# Patient Record
Sex: Male | Born: 1985 | Hispanic: Yes | Marital: Single | Smoking: Current every day smoker
Health system: Southern US, Community
[De-identification: ages and names within clinical notes are randomized; demographics above are authoritative.]

---

## 2017-08-13 ENCOUNTER — Emergency Department: Payer: Self-pay

## 2017-08-13 ENCOUNTER — Other Ambulatory Visit: Payer: Self-pay

## 2017-08-13 ENCOUNTER — Emergency Department
Admission: EM | Admit: 2017-08-13 | Discharge: 2017-08-13 | Disposition: A | Discharge status: Home / Self Care | Payer: Self-pay | Attending: Student in an Organized Health Care Education/Training Program | Admitting: Student in an Organized Health Care Education/Training Program

## 2017-08-13 DIAGNOSIS — R51 Headache: Secondary | ICD-10-CM | POA: Insufficient documentation

## 2017-08-13 DIAGNOSIS — R202 Paresthesia of skin: Secondary | ICD-10-CM | POA: Insufficient documentation

## 2017-08-13 DIAGNOSIS — F10929 Alcohol use, unspecified with intoxication, unspecified: Secondary | ICD-10-CM

## 2017-08-13 DIAGNOSIS — F4329 Adjustment disorder with other symptoms: Secondary | ICD-10-CM | POA: Insufficient documentation

## 2017-08-13 DIAGNOSIS — F10129 Alcohol abuse with intoxication, unspecified: Secondary | ICD-10-CM | POA: Insufficient documentation

## 2017-08-13 LAB — CBC WITH DIFFERENTIAL/PLATELET
Basophils Absolute: 0 10*3/uL (ref 0–0.1)
Basophils Relative: 1 %
Eosinophils Absolute: 0.1 10*3/uL (ref 0–0.7)
Eosinophils Relative: 2 %
HCT: 42.5 % (ref 40.0–52.0)
HEMOGLOBIN: 15.3 g/dL (ref 13.0–18.0)
LYMPHS ABS: 2.9 10*3/uL (ref 1.0–3.6)
LYMPHS PCT: 46 %
MCH: 32.4 pg (ref 26.0–34.0)
MCHC: 35.9 g/dL (ref 32.0–36.0)
MCV: 90.3 fL (ref 80.0–100.0)
Monocytes Absolute: 0.6 10*3/uL (ref 0.2–1.0)
Monocytes Relative: 9 %
NEUTROS PCT: 42 %
Neutro Abs: 2.6 10*3/uL (ref 1.4–6.5)
Platelets: 184 10*3/uL (ref 150–440)
RBC: 4.71 MIL/uL (ref 4.40–5.90)
RDW: 12.4 % (ref 11.5–14.5)
WBC: 6.2 10*3/uL (ref 3.8–10.6)

## 2017-08-13 LAB — COMPREHENSIVE METABOLIC PANEL
ALT: 28 U/L (ref 17–63)
AST: 28 U/L (ref 15–41)
Albumin: 4.2 g/dL (ref 3.5–5.0)
Alkaline Phosphatase: 57 U/L (ref 38–126)
Anion gap: 8 (ref 5–15)
BUN: 8 mg/dL (ref 6–20)
CO2: 24 mmol/L (ref 22–32)
CREATININE: 0.55 mg/dL — AB (ref 0.61–1.24)
Calcium: 8.8 mg/dL — ABNORMAL LOW (ref 8.9–10.3)
Chloride: 108 mmol/L (ref 101–111)
Glucose, Bld: 99 mg/dL (ref 65–99)
Potassium: 2.9 mmol/L — ABNORMAL LOW (ref 3.5–5.1)
SODIUM: 140 mmol/L (ref 135–145)
Total Bilirubin: 0.4 mg/dL (ref 0.3–1.2)
Total Protein: 7.8 g/dL (ref 6.5–8.1)

## 2017-08-13 LAB — ETHANOL: Alcohol, Ethyl (B): 121 mg/dL — ABNORMAL HIGH (ref <10)

## 2017-08-13 MED ORDER — LORAZEPAM 0.5 MG PO TABS
0.5000 mg | ORAL_TABLET | Freq: Once | ORAL | Status: AC
  Administered 2017-08-13: 0.5 mg via ORAL
  Filled 2017-08-13: qty 1

## 2017-08-13 MED ORDER — SODIUM CHLORIDE 0.9 % IV BOLUS
1000.0000 mL | Freq: Once | INTRAVENOUS | Status: AC
  Administered 2017-08-13: 1000 mL via INTRAVENOUS

## 2017-08-13 NOTE — ED Notes (Signed)
ED Provider at bedside to discuss disposition 

## 2017-08-13 NOTE — ED Provider Notes (Signed)
[facility] Emergency Department Provider Note    First MD Initiated Contact with Patient 08/13/17 2030     (approximate)  I have reviewed the triage vital signs and the nursing notes.   HISTORY  Chief Complaint Numbness    HPI Trevor Davenport is a 32 y.o. male with several months of intermittent bilateral hand numbness as well as headache and discomfort all over.  Patient also reports blurry vision.  States that his hands are cramping that he cannot move him however he is moving his hands without any deficit.  States that all the symptoms started and are largely related to his wife leaving him.  He denies any SI or HI.  No intent for self-harm.  History reviewed. No pertinent past medical history. No family history on file. History reviewed. No pertinent surgical history. There are no active problems to display for this patient.     Prior to Admission medications   Not on File    Allergies Patient has no known allergies.    Social History Social History   Tobacco Use  . Smoking status: Never Smoker  . Smokeless tobacco: Never Used  Substance Use Topics  . Alcohol use: Not on file  . Drug use: Not on file    Review of Systems Patient denies headaches, rhinorrhea, blurry vision, numbness, shortness of breath, chest pain, edema, cough, abdominal pain, nausea, vomiting, diarrhea, dysuria, fevers, rashes or hallucinations unless otherwise stated above in HPI. ____________________________________________   PHYSICAL EXAM:  VITAL SIGNS: Vitals:   08/13/17 2110 08/13/17 2130  BP: 115/74 102/63  Pulse: 64 72  Resp: 20 17  Temp:    SpO2: 94% 96%    Constitutional: Alert and oriented. Anxious and tearful appearing and in no acute distress. Eyes: Conjunctivae are normal.  Head: Atraumatic. Nose: No congestion/rhinnorhea. Mouth/Throat: Mucous membranes are moist.   Neck: No stridor. Painless ROM.  Cardiovascular: Normal rate,  regular rhythm. Grossly normal heart sounds.  Good peripheral circulation. Respiratory: Normal respiratory effort.  No retractions. Lungs CTAB. Gastrointestinal: Soft and nontender. No distention. No abdominal bruits. No CVA tenderness. Genitourinary:  Musculoskeletal: No lower extremity tenderness nor edema.  No joint effusions. Neurologic:  CN- intact.  No facial droop, Normal FNF.  Normal heel to shin.  Sensation intact bilaterally. Normal speech and language. No gross focal neurologic deficits are appreciated. No gait instability.  Skin:  Skin is warm, dry and intact. No rash noted. Psychiatric: anxious, tearful  ____________________________________________   LABS (all labs ordered are listed, but only abnormal results are displayed)  Results for orders placed or performed during the hospital encounter of 08/13/17 (from the past 24 hour(s))  CBC with Differential/Platelet     Status: None   Collection Time: 08/13/17  8:57 PM  Result Value Ref Range   WBC 6.2 3.8 - 10.6 K/uL   RBC 4.71 4.40 - 5.90 MIL/uL   Hemoglobin 15.3 13.0 - 18.0 g/dL   HCT 16.1 09.6 - 04.5 %   MCV 90.3 80.0 - 100.0 fL   MCH 32.4 26.0 - 34.0 pg   MCHC 35.9 32.0 - 36.0 g/dL   RDW 40.9 81.1 - 91.4 %   Platelets 184 150 - 440 K/uL   Neutrophils Relative % 42 %   Neutro Abs 2.6 1.4 - 6.5 K/uL   Lymphocytes Relative 46 %   Lymphs Abs 2.9 1.0 - 3.6 K/uL   Monocytes Relative 9 %   Monocytes Absolute 0.6 0.2 - 1.0 K/uL  Eosinophils Relative 2 %   Eosinophils Absolute 0.1 0 - 0.7 K/uL   Basophils Relative 1 %   Basophils Absolute 0.0 0 - 0.1 K/uL  Comprehensive metabolic panel     Status: Abnormal   Collection Time: 08/13/17  8:57 PM  Result Value Ref Range   Sodium 140 135 - 145 mmol/L   Potassium 2.9 (L) 3.5 - 5.1 mmol/L   Chloride 108 101 - 111 mmol/L   CO2 24 22 - 32 mmol/L   Glucose, Bld 99 65 - 99 mg/dL   BUN 8 6 - 20 mg/dL   Creatinine, Ser 1.090.55 (L) 0.61 - 1.24 mg/dL   Calcium 8.8 (L) 8.9 - 10.3  mg/dL   Total Protein 7.8 6.5 - 8.1 g/dL   Albumin 4.2 3.5 - 5.0 g/dL   AST 28 15 - 41 U/L   ALT 28 17 - 63 U/L   Alkaline Phosphatase 57 38 - 126 U/L   Total Bilirubin 0.4 0.3 - 1.2 mg/dL   GFR calc non Af Amer >60 >60 mL/min   GFR calc Af Amer >60 >60 mL/min   Anion gap 8 5 - 15  Ethanol     Status: Abnormal   Collection Time: 08/13/17  8:57 PM  Result Value Ref Range   Alcohol, Ethyl (B) 121 (H) <10 mg/dL   ____________________________________________  EKG My review and personal interpretation at Time: 20:49   Indication: sob  Rate: 60  Rhythm: sinus Axis: normal Other: normal intervals,, no stemi, no brugada or wpw ____________________________________________  RADIOLOGY I personally reviewed all radiographic images ordered to evaluate for the above acute complaints and reviewed radiology reports and findings.  These findings were personally discussed with the patient.  Please see medical record for radiology report.  ____________________________________________   PROCEDURES  Procedure(s) performed:  Procedures    Critical Care performed: no ____________________________________________   INITIAL IMPRESSION / ASSESSMENT AND PLAN / ED COURSE  Pertinent labs & imaging results that were available during my care of the patient were reviewed by me and considered in my medical decision making (see chart for details).  DDX: anxiety, conversion, dehydration, mass, radiculopathy, transient hypokalemic paralysis  Trevor Davenport is a 32 y.o. who presents to the ED with symptoms as described above.  Patient nontoxic-appearing.  Does seem to endorse lots of familial stressors that are precipitating the symptoms given the recent split with his wife and states that he misses his children.  Patient has been drinking.  No focal neuro deficits.  CT imaging ordered to evaluate for abnormality as he is intoxicated particular concerning for possible subdural or trauma that he is  not reporting.  No evidence of cervical fracture that would explain radiculopathy.  Blood work is otherwise reassuring.  Mild hyperkalemia.  Does not seem quite consistent with transient hypokalemic paralysis.  Certainly no evidence of ACS.  No evidence of infectious process.  Patient stable and appropriate for discharge home.      As part of my medical decision making, I reviewed the following data within the electronic MEDICAL RECORD NUMBER Nursing notes reviewed and incorporated, Labs reviewed, notes from prior ED visits and  Controlled Substance Database   ____________________________________________   FINAL CLINICAL IMPRESSION(S) / ED DIAGNOSES  Final diagnoses:  Paresthesia  Stress and adjustment reaction  Alcoholic intoxication with complication (HCC)      NEW MEDICATIONS STARTED DURING THIS VISIT:  New Prescriptions   No medications on file     Note:  This document  was prepared using Conservation officer, historic buildings and may include unintentional dictation errors.    Willy Eddy, MD 08/13/17 2222

## 2017-08-13 NOTE — ED Notes (Signed)
Patient transported to CT/xray 

## 2017-08-13 NOTE — ED Triage Notes (Addendum)
Pt arrives to ED via POV with c/o bilateral hand numbness and headache that started 20 minutes PTA. Pt also reports "all over pain". Pt reports blurry vision, no reports of N/V. No facial droop, MAEW (cannot test bilateral hand grip/strength d/t pt's c/o "hand cramping" and inability to move fingers). Pt reports drinking 2 beers 1-2 hrs PTA. Pt also reports s/x's have been on-going and intermittent for the last 6 months.

## 2017-08-13 NOTE — ED Notes (Signed)
Pt ambulatory with steady gait with his family to d/c desk for discharge.

## 2020-08-18 ENCOUNTER — Other Ambulatory Visit: Payer: Self-pay

## 2020-08-18 ENCOUNTER — Emergency Department
Admission: EM | Admit: 2020-08-18 | Discharge: 2020-08-18 | Disposition: A | Discharge status: Home / Self Care | Payer: Self-pay | Attending: Emergency Medicine | Admitting: Emergency Medicine

## 2020-08-18 ENCOUNTER — Emergency Department: Payer: Self-pay

## 2020-08-18 ENCOUNTER — Encounter: Payer: Self-pay | Admitting: Intensive Care

## 2020-08-18 DIAGNOSIS — K5730 Diverticulosis of large intestine without perforation or abscess without bleeding: Secondary | ICD-10-CM | POA: Insufficient documentation

## 2020-08-18 DIAGNOSIS — K625 Hemorrhage of anus and rectum: Secondary | ICD-10-CM | POA: Insufficient documentation

## 2020-08-18 DIAGNOSIS — F17210 Nicotine dependence, cigarettes, uncomplicated: Secondary | ICD-10-CM | POA: Insufficient documentation

## 2020-08-18 LAB — COMPREHENSIVE METABOLIC PANEL
ALT: 34 U/L (ref 0–44)
AST: 27 U/L (ref 15–41)
Albumin: 4.1 g/dL (ref 3.5–5.0)
Alkaline Phosphatase: 42 U/L (ref 38–126)
Anion gap: 9 (ref 5–15)
BUN: 10 mg/dL (ref 6–20)
CO2: 23 mmol/L (ref 22–32)
Calcium: 8.8 mg/dL — ABNORMAL LOW (ref 8.9–10.3)
Chloride: 106 mmol/L (ref 98–111)
Creatinine, Ser: 0.79 mg/dL (ref 0.61–1.24)
GFR, Estimated: >60 mL/min (ref >60)
Glucose, Bld: 106 mg/dL — ABNORMAL HIGH (ref 70–99)
Potassium: 3.7 mmol/L (ref 3.5–5.1)
Sodium: 138 mmol/L (ref 135–145)
Total Bilirubin: 0.9 mg/dL (ref 0.3–1.2)
Total Protein: 7.6 g/dL (ref 6.5–8.1)

## 2020-08-18 LAB — CBC
HCT: 43.7 % (ref 39.0–52.0)
Hemoglobin: 15.4 g/dL (ref 13.0–17.0)
MCH: 31.8 pg (ref 26.0–34.0)
MCHC: 35.2 g/dL (ref 30.0–36.0)
MCV: 90.3 fL (ref 80.0–100.0)
Platelets: 197 10*3/uL (ref 150–400)
RBC: 4.84 MIL/uL (ref 4.22–5.81)
RDW: 11.5 % (ref 11.5–15.5)
WBC: 7.3 10*3/uL (ref 4.0–10.5)
nRBC: 0 % (ref 0.0–0.2)

## 2020-08-18 LAB — TYPE AND SCREEN
ABO/RH(D): A POS
Antibody Screen: NEGATIVE

## 2020-08-18 MED ORDER — IOHEXOL 300 MG/ML  SOLN
100.0000 mL | Freq: Once | INTRAMUSCULAR | Status: AC | PRN
  Administered 2020-08-18: 100 mL via INTRAVENOUS
  Filled 2020-08-18: qty 100

## 2020-08-18 MED ORDER — FAMOTIDINE IN NACL 20-0.9 MG/50ML-% IV SOLN
20.0000 mg | Freq: Once | INTRAVENOUS | Status: AC
  Administered 2020-08-18: 20 mg via INTRAVENOUS
  Filled 2020-08-18: qty 50

## 2020-08-18 NOTE — ED Provider Notes (Signed)
[facility] Emergency Department Provider Note  ____________________________________________   Event Date/Time   First MD Initiated Contact with Patient 08/18/20 1607     (approximate)  I have reviewed the triage vital signs and the nursing notes.   HISTORY  Chief Complaint Rectal Bleeding  Patient is seen with the assistance of a medical spanish interpreter  HPI Trevor Davenport is a 35 y.o. male who presents to the emergency department for evaluation of rectal bleeding.  Patient states that he has had intermittent episodes of bright red rectal bleeding that he would notice when wiping that would occur approximately twice per week.  He says that this started approximately 2 months ago.  He states that over the last week, it has been more frequent, now with every bowel movement.  He reports diarrhea over the last 3 days without any other associated symptoms.  He states that it is not the entire bowel movement that was bloody, however there is a mild amount in the toilet bowl and a significant amount when wiping.  He denies any fever, dizziness, lightheadedness.  He does report a left lower "abdominal soreness" but denies that this is sharp pain.  He denies any history of similar.  He does report that he smokes a half a pack a day as well as drinks 6 beers daily.  He denies any chronic blood thinner or NSAID use.       History reviewed. No pertinent past medical history.  There are no problems to display for this patient.   History reviewed. No pertinent surgical history.  Prior to Admission medications   Not on File    Allergies Patient has no known allergies.  History reviewed. No pertinent family history.  Social History Social History   Tobacco Use  . Smoking status: Current Every Day Smoker    Types: Cigarettes  . Smokeless tobacco: Never Used  Substance Use Topics  . Alcohol use: Yes    Alcohol/week: 21.0 standard drinks     Types: 21 Cans of beer per week  . Drug use: Never    Review of Systems Constitutional: No fever/chills Eyes: No visual changes. ENT: No sore throat. Cardiovascular: Denies chest pain. Respiratory: Denies shortness of breath. Gastrointestinal: +hematochezia. No abdominal pain.  No nausea, no vomiting.  + diarrhea.  No constipation. Genitourinary: Negative for dysuria. Musculoskeletal: Negative for back pain. Skin: Negative for rash. Neurological: Negative for headaches, focal weakness or numbness.  ____________________________________________   PHYSICAL EXAM:  VITAL SIGNS: ED Triage Vitals  Enc Vitals Group     BP 08/18/20 1448 (!) 132/100     Pulse Rate 08/18/20 1448 88     Resp 08/18/20 1448 16     Temp 08/18/20 1448 97.7 F (36.5 C)     Temp Source 08/18/20 1448 Oral     SpO2 08/18/20 1448 98 %     Weight 08/18/20 1449 160 lb (72.6 kg)     Height 08/18/20 1449 5\' 7"  (1.702 m)     Head Circumference --      Peak Flow --      Pain Score 08/18/20 1449 0     Pain Loc --      Pain Edu? --      Excl. in GC? --    Constitutional: Alert and oriented. Well appearing and in no acute distress. Eyes: Conjunctivae are normal. PERRL. EOMI. Head: Atraumatic. Nose: No congestion/rhinnorhea. Mouth/Throat: Mucous membranes are moist.  Neck: No stridor.  Cardiovascular:  Normal rate, regular rhythm. Grossly normal heart sounds.  Good peripheral circulation. Respiratory: Normal respiratory effort.  No retractions. Lungs CTAB. Gastrointestinal: Soft without guarding. Pt reported "soreness" with deep palpation LLQ. No distention. Rectal exam does not reveal any external hemorrhoids, +brown stool with hemeoccult positive. No abdominal bruits. No CVA tenderness. Musculoskeletal: No lower extremity tenderness nor edema.  No joint effusions. Neurologic:  Normal speech and language. No gross focal neurologic deficits are appreciated. No gait instability. Skin:  Skin is warm, dry and intact.  No rash noted. Psychiatric: Mood and affect are normal. Speech and behavior are normal.  ____________________________________________   LABS (all labs ordered are listed, but only abnormal results are displayed)  Labs Reviewed  COMPREHENSIVE METABOLIC PANEL - Abnormal; Notable for the following components:      Result Value   Glucose, Bld 106 (*)    Calcium 8.8 (*)    All other components within normal limits  CBC  TYPE AND SCREEN   ____________________________________________  RADIOLOGY  Official radiology report(s): CT ABDOMEN PELVIS W CONTRAST  Result Date: 08/18/2020 CLINICAL DATA:  Bright red rectal bleeding x2 months. EXAM: CT ABDOMEN AND PELVIS WITH CONTRAST TECHNIQUE: Multidetector CT imaging of the abdomen and pelvis was performed using the standard protocol following bolus administration of intravenous contrast. CONTRAST:  OMNIPAQUE IOHEXOL 300 MG/ML  SOLN COMPARISON:  None. FINDINGS: Lower chest: Calcified right lower lobe granuloma on image 2/4. No acute abnormality. Hepatobiliary: Liver is grossly unremarkable without suspicious hepatic lesion. Gallbladder is decompressed otherwise unremarkable. No biliary ductal dilation. Pancreas: Unremarkable. No pancreatic ductal dilatation or surrounding inflammatory changes. Spleen: Normal in size without focal abnormality. Adrenals/Urinary Tract: Adrenal glands are unremarkable. Kidneys are normal, without renal calculi, focal lesion, or hydronephrosis. Bladder is unremarkable. Stomach/Bowel: Stomach is grossly unremarkable for degree distension. No suspicious small bowel dilation. Appendix is within normal limits. Colonic diverticulosis without findings of acute diverticulitis. Vascular/Lymphatic: No significant vascular findings are present. No enlarged abdominal or pelvic lymph nodes. Reproductive: Prostate is unremarkable. Other: No abdominal wall hernia or abnormality. No abdominopelvic ascites. Musculoskeletal: No acute or  significant osseous findings. IMPRESSION: 1. No acute abdominopelvic findings. 2. Colonic diverticulosis without findings of acute diverticulitis. Electronically Signed   By: Maudry Mayhew MD   On: 08/18/2020 19:00   ____________________________________________   INITIAL IMPRESSION / ASSESSMENT AND PLAN / ED COURSE  As part of my medical decision making, I reviewed the following data within the electronic MEDICAL RECORD NUMBER Nursing notes reviewed and incorporated, Interpreter needed, Labs reviewed and Notes from prior ED visits        Patient is a 35 year old male who presents to the emergency department for evaluation of rectal bleeding, see HPI for further details.  In triage, the patient has normal vital signs, is not tachycardic or febrile and is normotensive.  On physical exam, the patient has a reported "soreness" to the left lower quadrant with palpation.  Rectal exam does not reveal any external hemorrhoids, there is brown stool but is Hemoccult positive.  Initial laboratory evaluation including CBC, CMP.  CBC is unremarkable as well as CMP.  The patient's hemoglobin is stable at 15.4.  CT was obtained given the patient's symptoms, and there is evidence of diverticulosis without acute diverticulitis.  Otherwise the CT was normal.  Case was discussed with Dr. Fuller Plan, who recommends secure chat to GI with plans for outpatient follow-up given the patient's stability.  Secure chat was sent to Dr. Tobi Bastos who will get the patient in  within the next few weeks.  These findings were discussed with the patient via medical Spanish interpreter, and return precautions were discussed at length.  The patient is amenable with this plan, and is stable this time for outpatient follow-up.      ____________________________________________   FINAL CLINICAL IMPRESSION(S) / ED DIAGNOSES  Final diagnoses:  Rectal bleeding     ED Discharge Orders    None      *Please note:  Othon Haskel Dewalt was  evaluated in Emergency Department on 08/19/2020 for the symptoms described in the history of present illness. He was evaluated in the context of the global COVID-19 pandemic, which necessitated consideration that the patient might be at risk for infection with the SARS-CoV-2 virus that causes COVID-19. Institutional protocols and algorithms that pertain to the evaluation of patients at risk for COVID-19 are in a state of rapid change based on information released by regulatory bodies including the CDC and federal and state organizations. These policies and algorithms were followed during the patient's care in the ED.  Some ED evaluations and interventions may be delayed as a result of limited staffing during and the pandemic.*   Note:  This document was prepared using Dragon voice recognition software and may include unintentional dictation errors.   Lucy Chris, PA 08/19/20 1546    Concha Se, MD 08/20/20 [phone]

## 2020-08-18 NOTE — ED Triage Notes (Signed)
Patient c/o bright red rectal bleeding X2 months. Today the color was black.

## 2020-08-18 NOTE — Discharge Instructions (Addendum)
Please schedule an appointment to follow up with Dr. Tobi Bastos. Return for any worsening.

## 2022-07-20 IMAGING — CT CT ABD-PELV W/ CM
2 of 4 series · 15 of 46 positions shown, 17 images · IV contrast (APPLIED)
Comparison: None.

CLINICAL DATA: Bright red rectal bleeding x2 months.

EXAM:
CT ABDOMEN AND PELVIS WITH CONTRAST
TECHNIQUE: Multidetector CT imaging of the abdomen and pelvis was performed
using the standard protocol following bolus administration of
intravenous contrast.
CONTRAST:  100mL OMNIPAQUE IOHEXOL 300 MG/ML  SOLN

[Series 2: routine abd/pel with · axial · 0.68mm/px · z∈[-462,-22]mm · 12 of 102 slices shown, 14 images]
[im 9/102  soft-tissue]
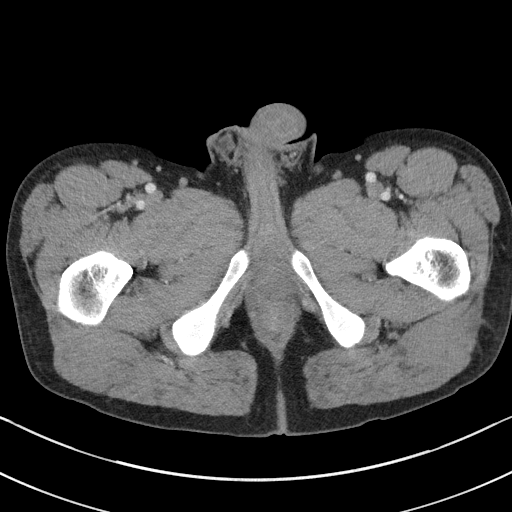
[im 9/102  bone]
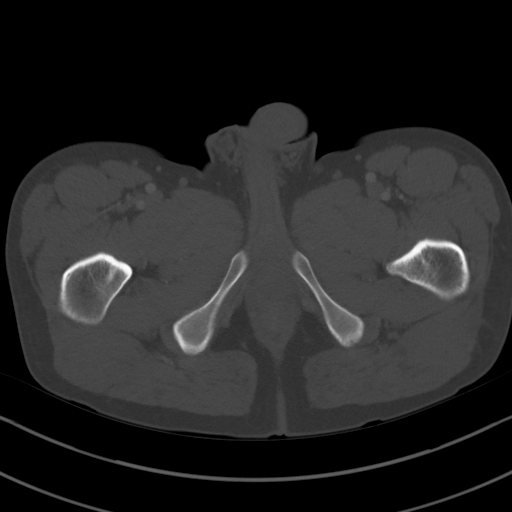
[im 17/102  soft-tissue]
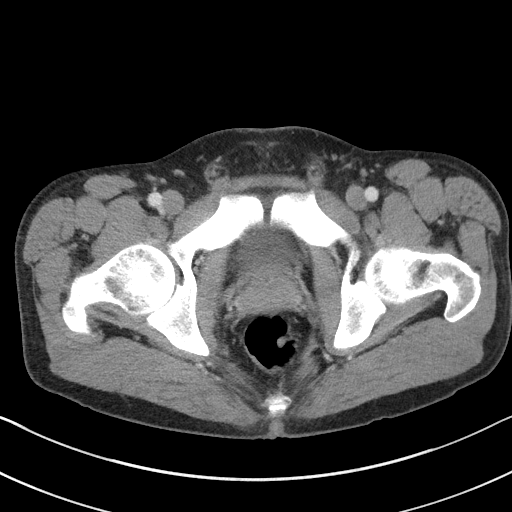
[im 25/102  soft-tissue]
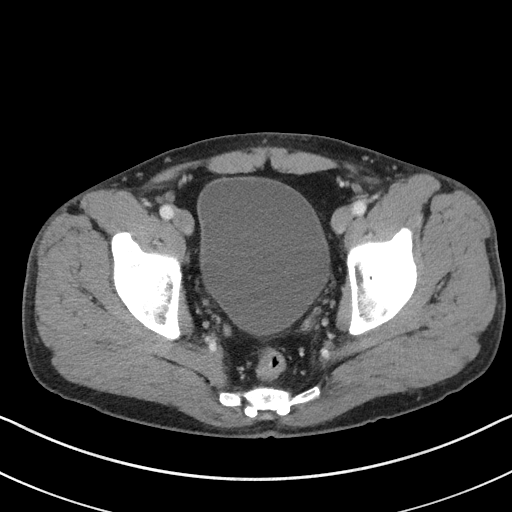
[im 33/102  soft-tissue]
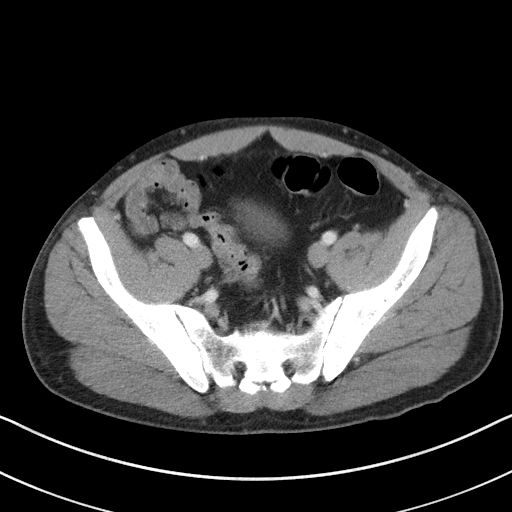
[im 41/102  soft-tissue]
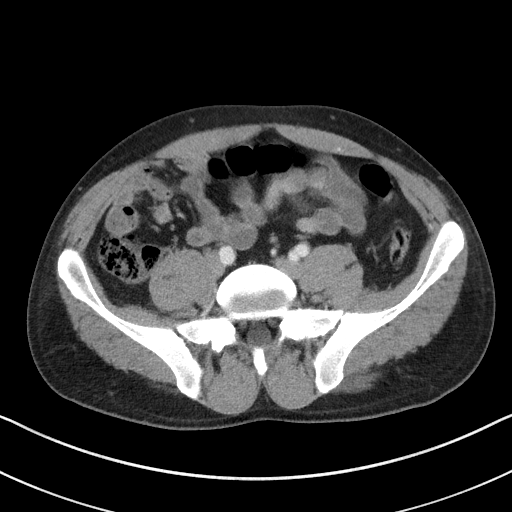
[im 49/102  soft-tissue]
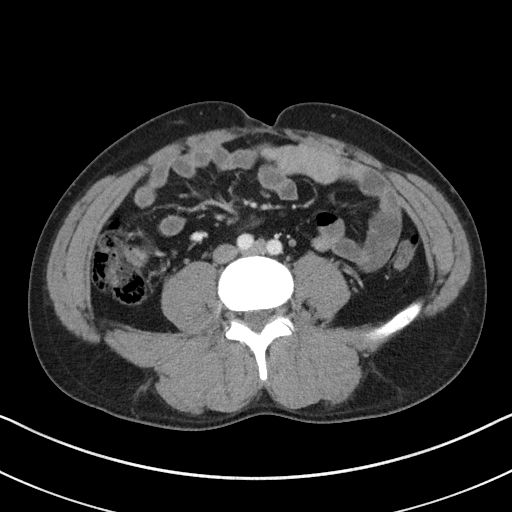
[im 57/102  soft-tissue]
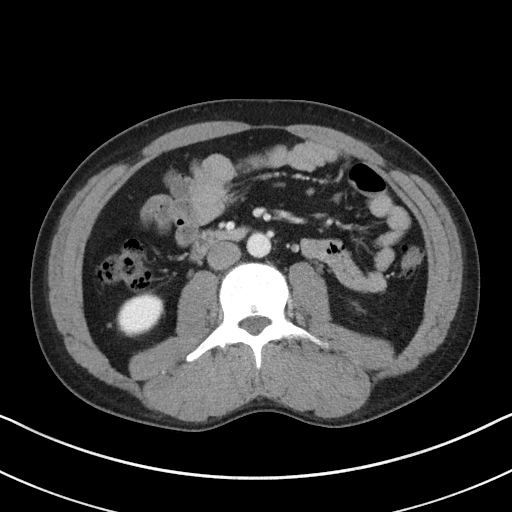
[im 65/102  soft-tissue]
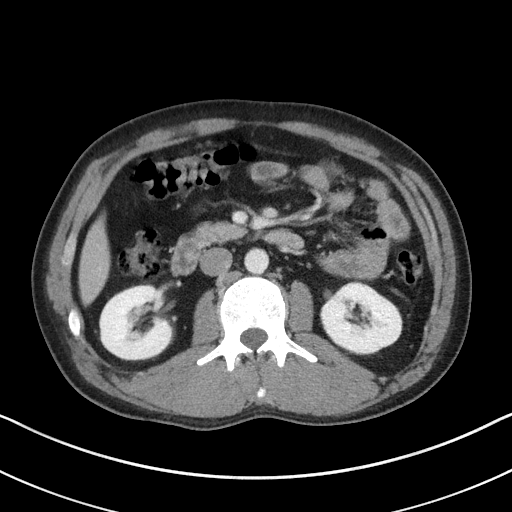
[im 73/102  soft-tissue]
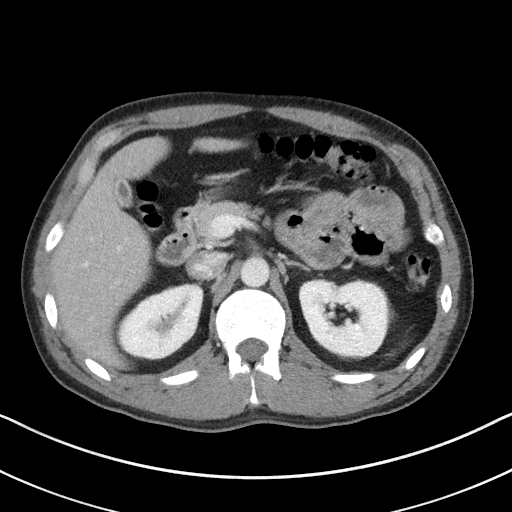
[im 73/102  bone]
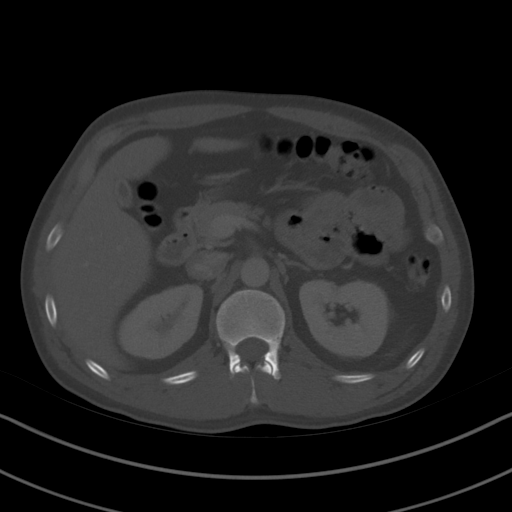
[im 81/102  soft-tissue]
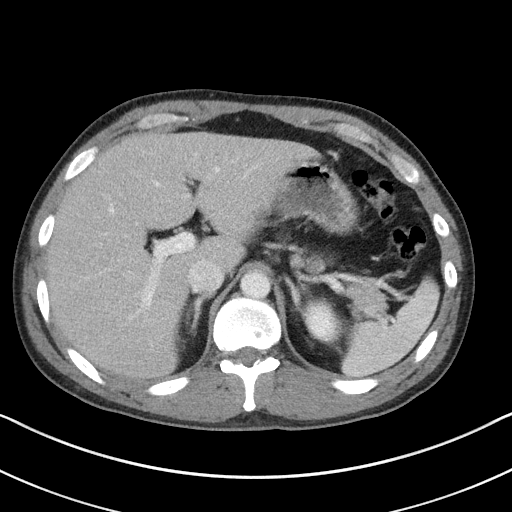
[im 89/102  soft-tissue]
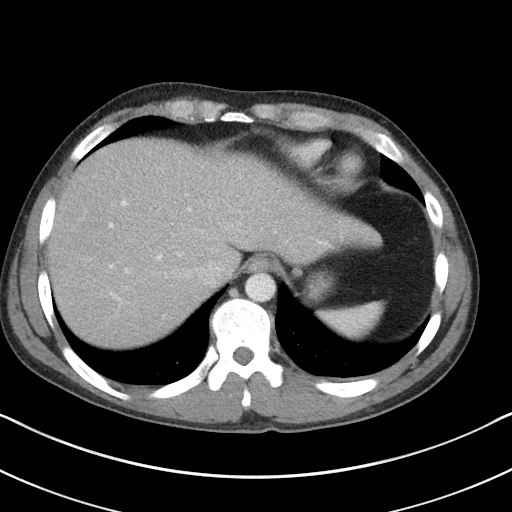
[im 97/102  soft-tissue]
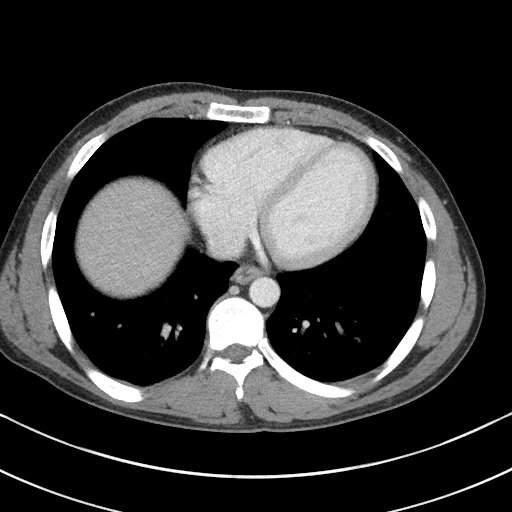

[Series 5: coronal st · coronal · 0.67mm/px · 3 of 84 slices shown]
[im 28/84  soft-tissue]
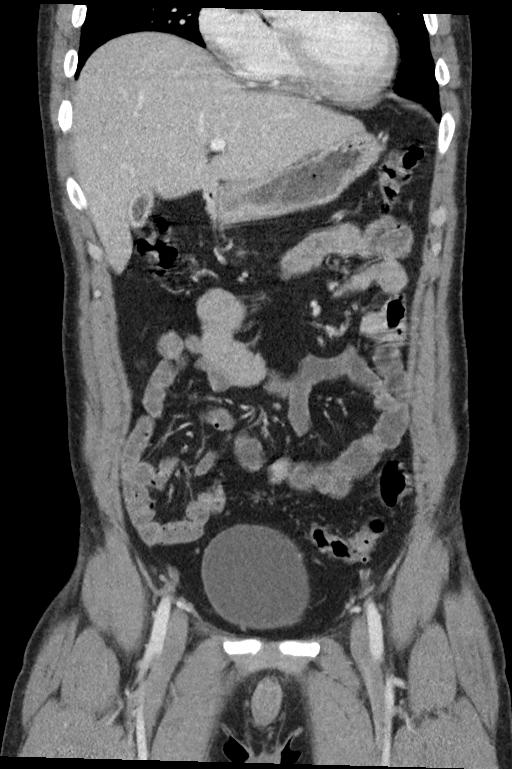
[im 37/84  soft-tissue]
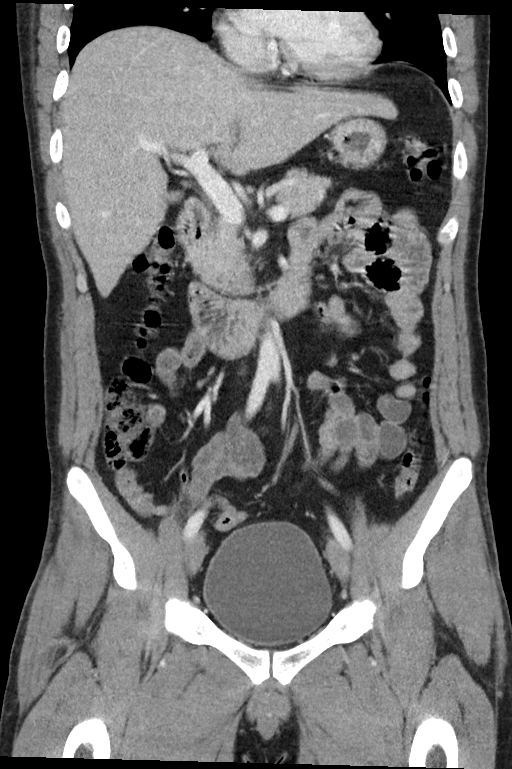
[im 47/84  soft-tissue]
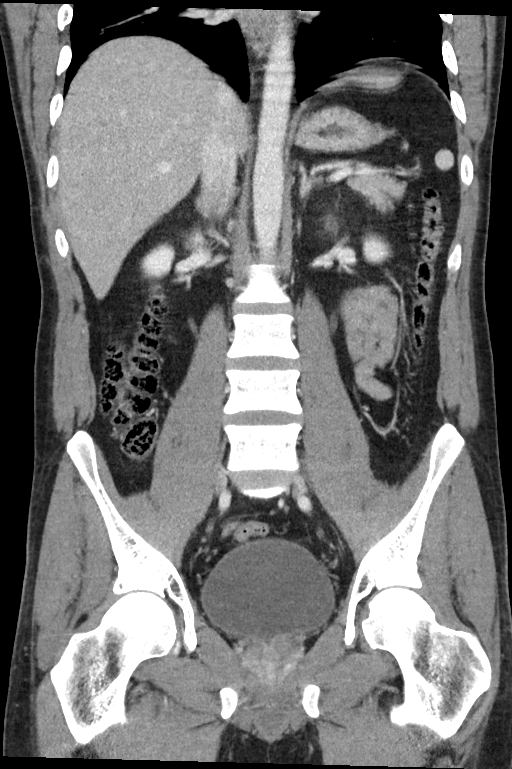

[15 of 46 positions shown; findings below may reference images not displayed]

FINDINGS: Lower chest: Calcified right lower lobe granuloma on image [DATE]. No
acute abnormality.

Hepatobiliary: Liver is grossly unremarkable without suspicious
hepatic lesion. Gallbladder is decompressed otherwise unremarkable.
No biliary ductal dilation.

Pancreas: Unremarkable. No pancreatic ductal dilatation or
surrounding inflammatory changes.

Spleen: Normal in size without focal abnormality.

Adrenals/Urinary Tract: Adrenal glands are unremarkable. Kidneys are
normal, without renal calculi, focal lesion, or hydronephrosis.
Bladder is unremarkable.

Stomach/Bowel: Stomach is grossly unremarkable for degree
distension. No suspicious small bowel dilation. Appendix is within
normal limits. Colonic diverticulosis without findings of acute
diverticulitis.

Vascular/Lymphatic: No significant vascular findings are present. No
enlarged abdominal or pelvic lymph nodes.

Reproductive: Prostate is unremarkable.

Other: No abdominal wall hernia or abnormality. No abdominopelvic
ascites.

Musculoskeletal: No acute or significant osseous findings.
IMPRESSION: 1. No acute abdominopelvic findings.
2. Colonic diverticulosis without findings of acute diverticulitis.
